# Patient Record
Sex: Female | Born: 1995 | Race: White | Hispanic: No | Marital: Single | Smoking: Never smoker
Health system: Southern US, Community
[De-identification: ages and names within clinical notes are randomized; demographics above are authoritative.]

## PROBLEM LIST (undated history)

## (undated) DIAGNOSIS — F419 Anxiety disorder, unspecified: Secondary | ICD-10-CM

## (undated) DIAGNOSIS — F32A Depression, unspecified: Secondary | ICD-10-CM

## (undated) DIAGNOSIS — E282 Polycystic ovarian syndrome: Secondary | ICD-10-CM

## (undated) DIAGNOSIS — K900 Celiac disease: Secondary | ICD-10-CM

## (undated) HISTORY — DX: Celiac disease: K90.0

## (undated) HISTORY — DX: Anxiety disorder, unspecified: F41.9

## (undated) HISTORY — DX: Polycystic ovarian syndrome: E28.2

## (undated) HISTORY — DX: Depression, unspecified: F32.A

---
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
## 2016-04-17 DIAGNOSIS — L309 Dermatitis, unspecified: Secondary | ICD-10-CM | POA: Diagnosis not present

## 2016-04-17 DIAGNOSIS — F902 Attention-deficit hyperactivity disorder, combined type: Secondary | ICD-10-CM | POA: Diagnosis not present

## 2016-04-18 DIAGNOSIS — B350 Tinea barbae and tinea capitis: Secondary | ICD-10-CM | POA: Diagnosis not present

## 2016-09-09 DIAGNOSIS — R51 Headache: Secondary | ICD-10-CM | POA: Diagnosis not present

## 2016-09-09 DIAGNOSIS — F902 Attention-deficit hyperactivity disorder, combined type: Secondary | ICD-10-CM | POA: Diagnosis not present

## 2016-09-09 DIAGNOSIS — J309 Allergic rhinitis, unspecified: Secondary | ICD-10-CM | POA: Diagnosis not present

## 2016-09-09 DIAGNOSIS — F418 Other specified anxiety disorders: Secondary | ICD-10-CM | POA: Diagnosis not present

## 2016-09-14 ENCOUNTER — Emergency Department (HOSPITAL_COMMUNITY)
Admission: EM | Admit: 2016-09-14 | Discharge: 2016-09-14 | Disposition: A | Discharge status: Home / Self Care | Payer: BLUE CROSS/BLUE SHIELD | Attending: Emergency Medicine | Admitting: Emergency Medicine

## 2016-09-14 ENCOUNTER — Encounter (HOSPITAL_COMMUNITY): Payer: Self-pay | Admitting: *Deleted

## 2016-09-14 DIAGNOSIS — R404 Transient alteration of awareness: Secondary | ICD-10-CM | POA: Diagnosis not present

## 2016-09-14 DIAGNOSIS — R55 Syncope and collapse: Secondary | ICD-10-CM

## 2016-09-14 DIAGNOSIS — R42 Dizziness and giddiness: Secondary | ICD-10-CM | POA: Diagnosis not present

## 2016-09-14 LAB — CBC WITH DIFFERENTIAL/PLATELET
BASOS PCT: 0 %
Basophils Absolute: 0 10*3/uL (ref 0.0–0.1)
EOS ABS: 0 10*3/uL (ref 0.0–0.7)
EOS PCT: 0 %
HCT: 36.7 % (ref 36.0–46.0)
Hemoglobin: 12.2 g/dL (ref 12.0–15.0)
LYMPHS ABS: 1.3 10*3/uL (ref 0.7–4.0)
Lymphocytes Relative: 20 %
MCH: 29.1 pg (ref 26.0–34.0)
MCHC: 33.2 g/dL (ref 30.0–36.0)
MCV: 87.6 fL (ref 78.0–100.0)
MONO ABS: 0.3 10*3/uL (ref 0.1–1.0)
MONOS PCT: 5 %
NEUTROS PCT: 75 %
Neutro Abs: 4.8 10*3/uL (ref 1.7–7.7)
PLATELETS: 186 10*3/uL (ref 150–400)
RBC: 4.19 MIL/uL (ref 3.87–5.11)
RDW: 12.6 % (ref 11.5–15.5)
WBC: 6.4 10*3/uL (ref 4.0–10.5)

## 2016-09-14 LAB — BASIC METABOLIC PANEL
Anion gap: 8 (ref 5–15)
BUN: 14 mg/dL (ref 6–20)
CHLORIDE: 105 mmol/L (ref 101–111)
CO2: 25 mmol/L (ref 22–32)
CREATININE: 0.72 mg/dL (ref 0.44–1.00)
Calcium: 8.6 mg/dL — ABNORMAL LOW (ref 8.9–10.3)
GFR calc Af Amer: >60 mL/min (ref >60)
GLUCOSE: 104 mg/dL — AB (ref 65–99)
POTASSIUM: 3.4 mmol/L — AB (ref 3.5–5.1)
Sodium: 138 mmol/L (ref 135–145)

## 2016-09-14 LAB — I-STAT BETA HCG BLOOD, ED (MC, WL, AP ONLY): I-stat hCG, quantitative: <5 m[IU]/mL (ref <5)

## 2016-09-14 LAB — TSH: TSH: 1.57 u[IU]/mL (ref 0.350–4.500)

## 2016-09-14 MED ORDER — SODIUM CHLORIDE 0.9 % IV BOLUS (SEPSIS)
1000.0000 mL | Freq: Once | INTRAVENOUS | Status: AC
  Administered 2016-09-14: 1000 mL via INTRAVENOUS

## 2016-09-14 NOTE — ED Triage Notes (Signed)
Pt arrives via EMS with c/o pt was out laying in the sun, walked over to the shade, laid down and had syncopal episode for about 1 minute. Recently started on zoloft dose. BP 96/60, 500 NS given, now 108/70.

## 2016-09-14 NOTE — Discharge Instructions (Signed)
Your blood work is reassuring. Please drink plenty of fluids.   Please return for worsening symptoms, including recurrent passing out, difficulty breathing, severe chest pain or any other symptoms concerning to you.  Please follow-up with your primary care doctor for re-evaluation

## 2016-09-14 NOTE — ED Provider Notes (Signed)
[facility]-EMERGENCY DEPT Provider Note   CSN: [id number] Arrival date & time: 09/14/16  1555     History   Chief Complaint No chief complaint on file.   HPI Virginia Walker is a 21 y.o. female.  The history is provided by the patient.  Loss of Consciousness   This is a new problem. The current episode started 1 to 2 hours ago. The problem occurs rarely. The problem has been resolved. She lost consciousness for a period of 1 to 5 minutes. The problem is associated with standing up. Associated symptoms include light-headedness, malaise/fatigue and nausea. Pertinent negatives include abdominal pain, back pain, bladder incontinence, chest pain, confusion, diaphoresis, fever, focal sensory loss, focal weakness, headaches, seizures, visual change and vomiting. She has tried certain positions for the symptoms. The treatment provided no relief.    21 year old female who presents with syncope. She has no significant PMH. Was sun tanning at the pool today. She got up from lying down and felt nauseous, dizzy, and saw spots. Tried to sit down, but subsequently passed out. Witnessed by strangers at pool who assisted her. Was told she had LOC for about 1 minutes. No seizure activity, bowel or urinary incontinence, tongue biting, headache, vomiting or diarrhea, melena/hematochezia, fever or chills. Has been very fatigued recently. No weight changes. Eating and drinking normally. No heavy vaginal bleeding. No chest pain or dyspnea. Now feels improved.  History reviewed. No pertinent past medical history.  There are no active problems to display for this patient.   History reviewed. No pertinent surgical history.  OB History    No data available       Home Medications    Prior to Admission medications   Not on File    Family History No family history on file.  Social History Social History  Substance Use Topics  . Smoking status: Never Smoker  . Smokeless tobacco: Never Used  . Alcohol  use Yes     Allergies   Patient has no allergy information on record.   Review of Systems Review of Systems  Constitutional: Positive for malaise/fatigue. Negative for diaphoresis and fever.  Respiratory: Negative for shortness of breath.   Cardiovascular: Positive for syncope. Negative for chest pain.  Gastrointestinal: Positive for nausea. Negative for abdominal pain and vomiting.  Genitourinary: Negative for bladder incontinence and vaginal bleeding.  Musculoskeletal: Negative for back pain.  Allergic/Immunologic: Negative for immunocompromised state.  Neurological: Positive for light-headedness. Negative for focal weakness, seizures and headaches.  Hematological: Does not bruise/bleed easily.  Psychiatric/Behavioral: Negative for confusion.  All other systems reviewed and are negative.    Physical Exam Updated Vital Signs BP 96/79 (BP Location: Right Arm)   Pulse 67   Temp 97.8 F (36.6 C) (Oral)   Resp 17   Ht 5\' 4"  (1.626 m)   Wt 135 lb (61.2 kg)   LMP 08/31/2016   SpO2 100%   BMI 23.17 kg/m   Physical Exam Physical Exam  Nursing note and vitals reviewed. Constitutional: non-toxic, and in no acute distress Head: Normocephalic and atraumatic.  Mouth/Throat: Oropharynx is clear and moist.  Neck: Normal range of motion. Neck supple.  Cardiovascular: Normal rate and regular rhythm.   Pulmonary/Chest: Effort normal and breath sounds normal.  Abdominal: Soft. There is no tenderness. There is no rebound and no guarding.  Musculoskeletal: Normal range of motion. no edema Neurological: Alert, no facial droop, fluent speech, moves all extremities symmetrically, PERRL, sensation to light touch int act throughout. Skin: Skin  is warm and dry.  Psychiatric: Cooperative   ED Treatments / Results  Labs (all labs ordered are listed, but only abnormal results are displayed) Labs Reviewed  BASIC METABOLIC PANEL - Abnormal; Notable for the following:       Result Value    Potassium 3.4 (*)    Glucose, Bld 104 (*)    Calcium 8.6 (*)    All other components within normal limits  CBC WITH DIFFERENTIAL/PLATELET  TSH  I-STAT BETA HCG BLOOD, ED ([facility], [facility], [facility] ONLY)    EKG  EKG Interpretation  Date/Time:  Sunday Sep 14 2016 16:16:10 EDT Ventricular Rate:  75 PR Interval:    QRS Duration: 91 QT Interval:  395 QTC Calculation: 442 R Axis:   59 Text Interpretation:  Sinus rhythm no ischemia or stigmata of arrythmia Confirmed by Trena Dunavan MD, Nathanie Ottley ([id number]) on 09/14/2016 5:07:54 PM       Radiology No results found.  Procedures Procedures (including critical care time)  Medications Ordered in ED Medications  sodium chloride 0.9 % bolus 1,000 mL (1,000 mLs Intravenous New Bag/Given 09/14/16 1632)     Initial Impression / Assessment and Plan / ED Course  I have reviewed the triage vital signs and the nursing notes.  Pertinent labs & imaging results that were available during my care of the patient were reviewed by me and considered in my medical decision making (see chart for details).     Presented with syncopal episode. She is not at her baseline. Her symptoms seem consistent with that of orthostasis. She is otherwise young and healthy and not at high risk for other serious etiologies of syncope. Her EKG does not show stigmata of arrhythmia or heart strain. No major electrolyte or metabolic derangements on blood work. She is not anemic. She is not pregnant. She is given IV fluids, and feels improved. She is felt stable for discharge home. Strict return and follow-up instructions reviewed. She and her father expressed understanding of all discharge instructions and felt comfortable with the plan of care.   Final Clinical Impressions(s) / ED Diagnoses   Final diagnoses:  Syncope and collapse    New Prescriptions New Prescriptions   No medications on file     Lavera Guise, MD 09/14/16 1718

## 2016-09-17 DIAGNOSIS — Z202 Contact with and (suspected) exposure to infections with a predominantly sexual mode of transmission: Secondary | ICD-10-CM | POA: Diagnosis not present

## 2016-09-29 DIAGNOSIS — R5383 Other fatigue: Secondary | ICD-10-CM | POA: Diagnosis not present

## 2016-09-29 DIAGNOSIS — F321 Major depressive disorder, single episode, moderate: Secondary | ICD-10-CM | POA: Diagnosis not present

## 2016-10-13 DIAGNOSIS — M4003 Postural kyphosis, cervicothoracic region: Secondary | ICD-10-CM | POA: Diagnosis not present

## 2016-10-13 DIAGNOSIS — M791 Myalgia: Secondary | ICD-10-CM | POA: Diagnosis not present

## 2016-10-13 DIAGNOSIS — M9902 Segmental and somatic dysfunction of thoracic region: Secondary | ICD-10-CM | POA: Diagnosis not present

## 2016-10-13 DIAGNOSIS — M9901 Segmental and somatic dysfunction of cervical region: Secondary | ICD-10-CM | POA: Diagnosis not present

## 2016-10-15 DIAGNOSIS — M791 Myalgia: Secondary | ICD-10-CM | POA: Diagnosis not present

## 2016-10-15 DIAGNOSIS — M9901 Segmental and somatic dysfunction of cervical region: Secondary | ICD-10-CM | POA: Diagnosis not present

## 2016-10-15 DIAGNOSIS — M4003 Postural kyphosis, cervicothoracic region: Secondary | ICD-10-CM | POA: Diagnosis not present

## 2016-10-15 DIAGNOSIS — M9902 Segmental and somatic dysfunction of thoracic region: Secondary | ICD-10-CM | POA: Diagnosis not present

## 2016-10-16 DIAGNOSIS — M9901 Segmental and somatic dysfunction of cervical region: Secondary | ICD-10-CM | POA: Diagnosis not present

## 2016-10-16 DIAGNOSIS — M4003 Postural kyphosis, cervicothoracic region: Secondary | ICD-10-CM | POA: Diagnosis not present

## 2016-10-16 DIAGNOSIS — M9902 Segmental and somatic dysfunction of thoracic region: Secondary | ICD-10-CM | POA: Diagnosis not present

## 2016-10-16 DIAGNOSIS — M791 Myalgia: Secondary | ICD-10-CM | POA: Diagnosis not present

## 2016-10-20 DIAGNOSIS — F321 Major depressive disorder, single episode, moderate: Secondary | ICD-10-CM | POA: Diagnosis not present

## 2016-10-21 DIAGNOSIS — M4003 Postural kyphosis, cervicothoracic region: Secondary | ICD-10-CM | POA: Diagnosis not present

## 2016-10-21 DIAGNOSIS — M9902 Segmental and somatic dysfunction of thoracic region: Secondary | ICD-10-CM | POA: Diagnosis not present

## 2016-10-21 DIAGNOSIS — M791 Myalgia: Secondary | ICD-10-CM | POA: Diagnosis not present

## 2016-10-21 DIAGNOSIS — M9901 Segmental and somatic dysfunction of cervical region: Secondary | ICD-10-CM | POA: Diagnosis not present

## 2016-10-23 DIAGNOSIS — M4003 Postural kyphosis, cervicothoracic region: Secondary | ICD-10-CM | POA: Diagnosis not present

## 2016-10-23 DIAGNOSIS — M9902 Segmental and somatic dysfunction of thoracic region: Secondary | ICD-10-CM | POA: Diagnosis not present

## 2016-10-23 DIAGNOSIS — M791 Myalgia: Secondary | ICD-10-CM | POA: Diagnosis not present

## 2016-10-23 DIAGNOSIS — M9901 Segmental and somatic dysfunction of cervical region: Secondary | ICD-10-CM | POA: Diagnosis not present

## 2016-11-07 DIAGNOSIS — M9901 Segmental and somatic dysfunction of cervical region: Secondary | ICD-10-CM | POA: Diagnosis not present

## 2016-11-07 DIAGNOSIS — M4003 Postural kyphosis, cervicothoracic region: Secondary | ICD-10-CM | POA: Diagnosis not present

## 2016-11-07 DIAGNOSIS — M791 Myalgia: Secondary | ICD-10-CM | POA: Diagnosis not present

## 2016-11-07 DIAGNOSIS — M9902 Segmental and somatic dysfunction of thoracic region: Secondary | ICD-10-CM | POA: Diagnosis not present

## 2016-11-13 DIAGNOSIS — M4003 Postural kyphosis, cervicothoracic region: Secondary | ICD-10-CM | POA: Diagnosis not present

## 2016-11-13 DIAGNOSIS — M9902 Segmental and somatic dysfunction of thoracic region: Secondary | ICD-10-CM | POA: Diagnosis not present

## 2016-11-13 DIAGNOSIS — M791 Myalgia: Secondary | ICD-10-CM | POA: Diagnosis not present

## 2016-11-13 DIAGNOSIS — M9901 Segmental and somatic dysfunction of cervical region: Secondary | ICD-10-CM | POA: Diagnosis not present

## 2016-11-18 DIAGNOSIS — M9902 Segmental and somatic dysfunction of thoracic region: Secondary | ICD-10-CM | POA: Diagnosis not present

## 2016-11-18 DIAGNOSIS — M9901 Segmental and somatic dysfunction of cervical region: Secondary | ICD-10-CM | POA: Diagnosis not present

## 2016-11-18 DIAGNOSIS — M791 Myalgia: Secondary | ICD-10-CM | POA: Diagnosis not present

## 2016-11-18 DIAGNOSIS — M4003 Postural kyphosis, cervicothoracic region: Secondary | ICD-10-CM | POA: Diagnosis not present

## 2016-11-24 DIAGNOSIS — M9901 Segmental and somatic dysfunction of cervical region: Secondary | ICD-10-CM | POA: Diagnosis not present

## 2016-11-24 DIAGNOSIS — M9902 Segmental and somatic dysfunction of thoracic region: Secondary | ICD-10-CM | POA: Diagnosis not present

## 2016-11-24 DIAGNOSIS — M4003 Postural kyphosis, cervicothoracic region: Secondary | ICD-10-CM | POA: Diagnosis not present

## 2016-11-24 DIAGNOSIS — M791 Myalgia: Secondary | ICD-10-CM | POA: Diagnosis not present

## 2016-12-01 DIAGNOSIS — M4003 Postural kyphosis, cervicothoracic region: Secondary | ICD-10-CM | POA: Diagnosis not present

## 2016-12-01 DIAGNOSIS — M9902 Segmental and somatic dysfunction of thoracic region: Secondary | ICD-10-CM | POA: Diagnosis not present

## 2016-12-01 DIAGNOSIS — M9901 Segmental and somatic dysfunction of cervical region: Secondary | ICD-10-CM | POA: Diagnosis not present

## 2016-12-01 DIAGNOSIS — M791 Myalgia: Secondary | ICD-10-CM | POA: Diagnosis not present

## 2016-12-04 DIAGNOSIS — M9902 Segmental and somatic dysfunction of thoracic region: Secondary | ICD-10-CM | POA: Diagnosis not present

## 2016-12-04 DIAGNOSIS — M9901 Segmental and somatic dysfunction of cervical region: Secondary | ICD-10-CM | POA: Diagnosis not present

## 2016-12-04 DIAGNOSIS — M4003 Postural kyphosis, cervicothoracic region: Secondary | ICD-10-CM | POA: Diagnosis not present

## 2016-12-04 DIAGNOSIS — M791 Myalgia: Secondary | ICD-10-CM | POA: Diagnosis not present

## 2016-12-08 DIAGNOSIS — M791 Myalgia: Secondary | ICD-10-CM | POA: Diagnosis not present

## 2016-12-08 DIAGNOSIS — M4003 Postural kyphosis, cervicothoracic region: Secondary | ICD-10-CM | POA: Diagnosis not present

## 2016-12-08 DIAGNOSIS — M9901 Segmental and somatic dysfunction of cervical region: Secondary | ICD-10-CM | POA: Diagnosis not present

## 2016-12-08 DIAGNOSIS — M9902 Segmental and somatic dysfunction of thoracic region: Secondary | ICD-10-CM | POA: Diagnosis not present

## 2016-12-10 DIAGNOSIS — F902 Attention-deficit hyperactivity disorder, combined type: Secondary | ICD-10-CM | POA: Diagnosis not present

## 2016-12-10 DIAGNOSIS — F321 Major depressive disorder, single episode, moderate: Secondary | ICD-10-CM | POA: Diagnosis not present

## 2016-12-11 DIAGNOSIS — M791 Myalgia: Secondary | ICD-10-CM | POA: Diagnosis not present

## 2016-12-11 DIAGNOSIS — M9901 Segmental and somatic dysfunction of cervical region: Secondary | ICD-10-CM | POA: Diagnosis not present

## 2016-12-11 DIAGNOSIS — M4003 Postural kyphosis, cervicothoracic region: Secondary | ICD-10-CM | POA: Diagnosis not present

## 2016-12-11 DIAGNOSIS — M9902 Segmental and somatic dysfunction of thoracic region: Secondary | ICD-10-CM | POA: Diagnosis not present

## 2017-05-05 DIAGNOSIS — F902 Attention-deficit hyperactivity disorder, combined type: Secondary | ICD-10-CM | POA: Diagnosis not present

## 2017-05-05 DIAGNOSIS — F411 Generalized anxiety disorder: Secondary | ICD-10-CM | POA: Diagnosis not present

## 2017-05-05 DIAGNOSIS — F321 Major depressive disorder, single episode, moderate: Secondary | ICD-10-CM | POA: Diagnosis not present

## 2017-11-09 DIAGNOSIS — J309 Allergic rhinitis, unspecified: Secondary | ICD-10-CM | POA: Diagnosis not present

## 2017-11-09 DIAGNOSIS — F321 Major depressive disorder, single episode, moderate: Secondary | ICD-10-CM | POA: Diagnosis not present

## 2018-02-11 DIAGNOSIS — R630 Anorexia: Secondary | ICD-10-CM | POA: Diagnosis not present

## 2018-02-11 DIAGNOSIS — Z6824 Body mass index (BMI) 24.0-24.9, adult: Secondary | ICD-10-CM | POA: Diagnosis not present

## 2018-02-11 DIAGNOSIS — R110 Nausea: Secondary | ICD-10-CM | POA: Diagnosis not present

## 2018-02-27 DIAGNOSIS — R109 Unspecified abdominal pain: Secondary | ICD-10-CM | POA: Diagnosis not present

## 2018-03-02 DIAGNOSIS — Z6823 Body mass index (BMI) 23.0-23.9, adult: Secondary | ICD-10-CM | POA: Diagnosis not present

## 2018-03-02 DIAGNOSIS — R1011 Right upper quadrant pain: Secondary | ICD-10-CM | POA: Diagnosis not present

## 2018-03-03 DIAGNOSIS — R1011 Right upper quadrant pain: Secondary | ICD-10-CM | POA: Diagnosis not present

## 2018-03-08 DIAGNOSIS — R1084 Generalized abdominal pain: Secondary | ICD-10-CM | POA: Diagnosis not present

## 2018-03-08 DIAGNOSIS — K529 Noninfective gastroenteritis and colitis, unspecified: Secondary | ICD-10-CM | POA: Diagnosis not present

## 2018-03-08 DIAGNOSIS — R630 Anorexia: Secondary | ICD-10-CM | POA: Diagnosis not present

## 2018-03-08 DIAGNOSIS — R112 Nausea with vomiting, unspecified: Secondary | ICD-10-CM | POA: Diagnosis not present

## 2018-03-11 DIAGNOSIS — Z79899 Other long term (current) drug therapy: Secondary | ICD-10-CM | POA: Diagnosis not present

## 2018-03-11 DIAGNOSIS — R630 Anorexia: Secondary | ICD-10-CM | POA: Diagnosis not present

## 2018-03-11 DIAGNOSIS — I10 Essential (primary) hypertension: Secondary | ICD-10-CM | POA: Diagnosis not present

## 2018-03-11 DIAGNOSIS — R1084 Generalized abdominal pain: Secondary | ICD-10-CM | POA: Diagnosis not present

## 2018-03-11 DIAGNOSIS — J45909 Unspecified asthma, uncomplicated: Secondary | ICD-10-CM | POA: Diagnosis not present

## 2018-03-11 DIAGNOSIS — R112 Nausea with vomiting, unspecified: Secondary | ICD-10-CM | POA: Diagnosis not present

## 2018-03-11 DIAGNOSIS — K2950 Unspecified chronic gastritis without bleeding: Secondary | ICD-10-CM | POA: Diagnosis not present

## 2018-03-11 DIAGNOSIS — K529 Noninfective gastroenteritis and colitis, unspecified: Secondary | ICD-10-CM | POA: Diagnosis not present

## 2018-03-11 DIAGNOSIS — R197 Diarrhea, unspecified: Secondary | ICD-10-CM | POA: Diagnosis not present

## 2018-03-11 DIAGNOSIS — Z7982 Long term (current) use of aspirin: Secondary | ICD-10-CM | POA: Diagnosis not present

## 2018-04-04 DIAGNOSIS — S0993XA Unspecified injury of face, initial encounter: Secondary | ICD-10-CM | POA: Diagnosis not present

## 2018-04-04 DIAGNOSIS — S0231XA Fracture of orbital floor, right side, initial encounter for closed fracture: Secondary | ICD-10-CM | POA: Diagnosis not present

## 2018-04-04 DIAGNOSIS — S098XXA Other specified injuries of head, initial encounter: Secondary | ICD-10-CM | POA: Diagnosis not present

## 2018-04-04 DIAGNOSIS — Z7982 Long term (current) use of aspirin: Secondary | ICD-10-CM | POA: Diagnosis not present

## 2018-04-04 DIAGNOSIS — X58XXXA Exposure to other specified factors, initial encounter: Secondary | ICD-10-CM | POA: Diagnosis not present

## 2018-04-04 DIAGNOSIS — S0990XA Unspecified injury of head, initial encounter: Secondary | ICD-10-CM | POA: Diagnosis not present

## 2018-04-04 DIAGNOSIS — S0230XA Fracture of orbital floor, unspecified side, initial encounter for closed fracture: Secondary | ICD-10-CM | POA: Diagnosis not present

## 2018-04-07 DIAGNOSIS — R198 Other specified symptoms and signs involving the digestive system and abdomen: Secondary | ICD-10-CM | POA: Diagnosis not present

## 2018-05-05 DIAGNOSIS — H05331 Deformity of right orbit due to trauma or surgery: Secondary | ICD-10-CM | POA: Diagnosis not present

## 2019-12-12 ENCOUNTER — Encounter: Payer: Self-pay | Admitting: Neurology

## 2019-12-12 ENCOUNTER — Ambulatory Visit (INDEPENDENT_AMBULATORY_CARE_PROVIDER_SITE_OTHER): Payer: 59 | Admitting: Family Medicine

## 2019-12-12 ENCOUNTER — Encounter: Payer: Self-pay | Admitting: Family Medicine

## 2019-12-12 ENCOUNTER — Telehealth: Payer: Self-pay | Admitting: Family Medicine

## 2019-12-12 ENCOUNTER — Other Ambulatory Visit: Payer: Self-pay

## 2019-12-12 VITALS — BP 104/64 | HR 68 | Ht 64.0 in | Wt 126.6 lb

## 2019-12-12 DIAGNOSIS — F329 Major depressive disorder, single episode, unspecified: Secondary | ICD-10-CM

## 2019-12-12 DIAGNOSIS — F32A Depression, unspecified: Secondary | ICD-10-CM

## 2019-12-12 DIAGNOSIS — Z8719 Personal history of other diseases of the digestive system: Secondary | ICD-10-CM

## 2019-12-12 DIAGNOSIS — L749 Eccrine sweat disorder, unspecified: Secondary | ICD-10-CM | POA: Insufficient documentation

## 2019-12-12 DIAGNOSIS — Z87898 Personal history of other specified conditions: Secondary | ICD-10-CM

## 2019-12-12 DIAGNOSIS — F419 Anxiety disorder, unspecified: Secondary | ICD-10-CM

## 2019-12-12 DIAGNOSIS — E739 Lactose intolerance, unspecified: Secondary | ICD-10-CM | POA: Insufficient documentation

## 2019-12-12 DIAGNOSIS — R569 Unspecified convulsions: Secondary | ICD-10-CM | POA: Insufficient documentation

## 2019-12-12 DIAGNOSIS — K9049 Malabsorption due to intolerance, not elsewhere classified: Secondary | ICD-10-CM

## 2019-12-12 MED ORDER — FLUOXETINE HCL 20 MG PO CAPS: 20.0000 mg | ORAL_CAPSULE | Freq: Every day | ORAL | 2 refills | Status: DC

## 2019-12-12 NOTE — Telephone Encounter (Signed)
Orders are in

## 2019-12-12 NOTE — Telephone Encounter (Signed)
Pt would like to come in just for labs that she did not have done today. She has a CPE in Sept but would like to have these labs done this week. Please place orders for these labs.

## 2019-12-12 NOTE — Progress Notes (Signed)
Subjective:    Patient ID: Virginia Walker, female    DOB: 01/11/1996, 24 y.o.   MRN: [id number]  HPI Chief Complaint  Patient presents with   new pt    new pt get established. referrals to  GI- celiac disease, Endocrinology- blood sugars and thyroid issues. seeing therapist for depression on treatment   She is new to the practice and here to establish care. 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  States she was in [place] last year. Has been doing telehealth visits.   Pediatrician was here in [place]. [facility]- Dr. Nash Dimmer   States she has been taking fluoxetine for depression and anxiety. States she is unable to manage without it.  Tried Lexapro but it did not work as well.  States she has an upcoming appt with a therapist at [facility].  Mother with depression.   States she was diagnosed with celiac disease in December 2019 in [place]. States actually she "self diagnosed" but her GI agreed.  States she was told she had "severe damage to my villi". Requests a referral to GI.  States gluten causes severe vomiting, diarrhea and severe dehydration. States she basically has to get IV fluids after eating gluten. States she has "always had GI problems".  Appetite has been limited since the beginning of 2019.  States she is afraid she is malnourished.  States she had an EGD and colonoscopy in 2019 in [facility]   States she lost 30 lbs in two months before she realized her diagnosis. Weight has been stable lately.   Reports a 2 month history of dizziness, sweating and is afraid she has low blood sugars.   States she had a seizure 30 seconds after receiving Moderna vaccine in April.  Reports having a 2nd seizure in June 2-3 minutes after a blood draw. States she did not have health insurance so she did not go to the ED.   Denies fever, chills, chest pain, palpitations, shortness of breath, urinary symptoms.    Social  history: single, works as a Social worker  Denies smoking, drinks alcohol once weekly-3 drinks usually, occasional marijuana use   Immunizations: Moderna but only the first dose    States she has an appt later this week with Dr. Connye Burkitt at [facility]     Reviewed allergies, medications, past medical, surgical, family, and social history.     Review of Systems Pertinent positives and negatives in the history of present illness.     Objective:   Physical Exam BP 104/64    Pulse 68    Ht 5\' 4"  (1.626 m)    Wt 126 lb 9.6 oz (57.4 kg)    BMI 21.73 kg/m         Assessment & Plan:  Anxiety and depression - Plan: FLUoxetine (PROZAC) 20 MG capsule Continue taking Prozac. See therapist as scheduled.   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 Referral to GI per request.   History of GI disease - Plan: Ambulatory referral to Gastroenterology  New onset seizure [facility]) - Plan: Ambulatory referral to Neurology -seizure activity on 2 occasions per patient. Has not been worked up. Refer to neurology for evaluation and to determine if activity was actually seizure activity.   Counseling on hypoglycemia and recommend that she avoid skipping meals, eat small frequent meals with protein to balance blood sugar. Recommend she get  a blood sugar monitor and check her BS when she has symptoms.   She declines labs today stating she would like to bring someone with her for support. States she has a CPE scheduled with me in the next few weeks but she would like to return sooner than that for labs.  Requests specifically thyroid work up and Hgb A1c.

## 2019-12-12 NOTE — Patient Instructions (Addendum)
It was a pleasure meeting you today.  I have refilled your fluoxetine.  You should hear from [facility] gastroenterology to schedule a visit.  I am also referring you to neurology as discussed for new onset seizures.  You may schedule a visit to return when you are ready to do blood work.  Try to avoid skipping meals and try to eat small frequent meals throughout the day that includes protein.  If you decide to get a blood sugar monitoring kit, check your blood sugar when you are having symptoms.  Normal fasting blood sugar is 70-100.  If you decide to wait until your fasting CPE then that is okay as well.

## 2019-12-15 ENCOUNTER — Institutional Professional Consult (permissible substitution): Payer: BLUE CROSS/BLUE SHIELD | Admitting: Family Medicine

## 2019-12-21 ENCOUNTER — Telehealth: Payer: Self-pay | Admitting: Family Medicine

## 2019-12-21 NOTE — Telephone Encounter (Signed)
Pt called and is scheduled for CPE on 9/14. She said she has panic attacks then a seizure every time she gets blood drawn. She wants to see if she can possibly get something prescribed to her a few days before the appt to hopefully prevent this from happening.

## 2019-12-21 NOTE — Telephone Encounter (Signed)
Those labs have been ordered already.

## 2019-12-21 NOTE — Telephone Encounter (Signed)
Please ask if she is getting blood work at her office visit or on a different day. What has she taken in the past for her anxiety?

## 2019-12-21 NOTE — Telephone Encounter (Signed)
Pt states she is wanting to be tested for low blood sugar in an A1c and Tsh. Even though pt has an appt for cpe she does not want these tests to be related to her cpe. Is this okay for her to come in like tomorrow for blood work  She was on lexapro in 2018 and then was put on prozac 2019 and is currently on prozac 20mg  now

## 2019-12-22 ENCOUNTER — Other Ambulatory Visit: Payer: Self-pay | Admitting: Family Medicine

## 2019-12-22 DIAGNOSIS — F419 Anxiety disorder, unspecified: Secondary | ICD-10-CM

## 2019-12-22 MED ORDER — ALPRAZOLAM 0.25 MG PO TABS: 0.5000 mg | ORAL_TABLET | Freq: Once | ORAL | 0 refills | Status: AC

## 2019-12-22 NOTE — Telephone Encounter (Signed)
Pt is coming in Monday afternoon. Has neuro appt in novemeber

## 2019-12-22 NOTE — Telephone Encounter (Signed)
I will send in medication to help with anxiety. She can take this 30-60 minutes before having her labs drawn. She should have someone drive her in case she is sedated and unsafe to drive. Does she have a neurology appt scheduled yet?

## 2019-12-22 NOTE — Telephone Encounter (Signed)
Pt needs something for her nerves prior to coming in as she states she has seizures when she gets blood drawn and needs something to help her calm her nerves. I asked if she had taken anything before like xanax and she said she doesn't remember the name but she had something when she was in hospital before to calm her nerves.

## 2019-12-26 ENCOUNTER — Other Ambulatory Visit: Payer: Self-pay

## 2019-12-26 ENCOUNTER — Encounter: Payer: Self-pay | Admitting: Gastroenterology

## 2019-12-26 ENCOUNTER — Other Ambulatory Visit: Payer: 59

## 2019-12-26 DIAGNOSIS — K9049 Malabsorption due to intolerance, not elsewhere classified: Secondary | ICD-10-CM

## 2019-12-26 DIAGNOSIS — L749 Eccrine sweat disorder, unspecified: Secondary | ICD-10-CM

## 2019-12-26 DIAGNOSIS — F419 Anxiety disorder, unspecified: Secondary | ICD-10-CM

## 2019-12-26 DIAGNOSIS — Z87898 Personal history of other specified conditions: Secondary | ICD-10-CM

## 2019-12-27 LAB — COMPREHENSIVE METABOLIC PANEL
ALT: 11 IU/L (ref 0–32)
AST: 21 IU/L (ref 0–40)
Albumin/Globulin Ratio: 2.2 (ref 1.2–2.2)
Albumin: 4.7 g/dL (ref 3.9–5.0)
Alkaline Phosphatase: 48 IU/L (ref 48–121)
BUN/Creatinine Ratio: 11 (ref 9–23)
BUN: 7 mg/dL (ref 6–20)
Bilirubin Total: 0.5 mg/dL (ref 0.0–1.2)
CO2: 24 mmol/L (ref 20–29)
Calcium: 9.7 mg/dL (ref 8.7–10.2)
Chloride: 103 mmol/L (ref 96–106)
Creatinine, Ser: 0.65 mg/dL (ref 0.57–1.00)
GFR calc Af Amer: 145 mL/min/{1.73_m2} (ref >59)
GFR calc non Af Amer: 126 mL/min/{1.73_m2} (ref >59)
Globulin, Total: 2.1 g/dL (ref 1.5–4.5)
Glucose: 95 mg/dL (ref 65–99)
Potassium: 3.5 mmol/L (ref 3.5–5.2)
Sodium: 141 mmol/L (ref 134–144)
Total Protein: 6.8 g/dL (ref 6.0–8.5)

## 2019-12-27 LAB — CBC WITH DIFFERENTIAL/PLATELET
Basophils Absolute: 0 10*3/uL (ref 0.0–0.2)
Basos: 0 %
EOS (ABSOLUTE): 0 10*3/uL (ref 0.0–0.4)
Eos: 1 %
Hematocrit: 36.6 % (ref 34.0–46.6)
Hemoglobin: 12.4 g/dL (ref 11.1–15.9)
Immature Grans (Abs): 0 10*3/uL (ref 0.0–0.1)
Immature Granulocytes: 1 %
Lymphocytes Absolute: 1.5 10*3/uL (ref 0.7–3.1)
Lymphs: 25 %
MCH: 31.8 pg (ref 26.6–33.0)
MCHC: 33.9 g/dL (ref 31.5–35.7)
MCV: 94 fL (ref 79–97)
Monocytes Absolute: 0.5 10*3/uL (ref 0.1–0.9)
Monocytes: 8 %
Neutrophils Absolute: 3.8 10*3/uL (ref 1.4–7.0)
Neutrophils: 65 %
Platelets: 217 10*3/uL (ref 150–450)
RBC: 3.9 x10E6/uL (ref 3.77–5.28)
RDW: 11.8 % (ref 11.7–15.4)
WBC: 5.8 10*3/uL (ref 3.4–10.8)

## 2019-12-27 LAB — T4, FREE: Free T4: 1.35 ng/dL (ref 0.82–1.77)

## 2019-12-27 LAB — TSH: TSH: 0.951 u[IU]/mL (ref 0.450–4.500)

## 2019-12-27 LAB — HEMOGLOBIN A1C
Est. average glucose Bld gHb Est-mCnc: 103 mg/dL
Hgb A1c MFr Bld: 5.2 % (ref 4.8–5.6)

## 2019-12-27 NOTE — Progress Notes (Signed)
Her labs are all normal including blood counts, electrolytes, kidney, liver, thyroid functions. No sign of diabetes or even prediabetes.

## 2019-12-29 ENCOUNTER — Encounter: Payer: Self-pay | Admitting: Family Medicine

## 2019-12-29 ENCOUNTER — Other Ambulatory Visit: Payer: Self-pay

## 2019-12-29 ENCOUNTER — Ambulatory Visit: Payer: 59 | Admitting: Family Medicine

## 2019-12-29 VITALS — BP 120/80 | HR 92 | Wt 123.2 lb

## 2019-12-29 DIAGNOSIS — R630 Anorexia: Secondary | ICD-10-CM

## 2019-12-29 DIAGNOSIS — F419 Anxiety disorder, unspecified: Secondary | ICD-10-CM

## 2019-12-29 DIAGNOSIS — F32A Depression, unspecified: Secondary | ICD-10-CM

## 2019-12-29 DIAGNOSIS — F329 Major depressive disorder, single episode, unspecified: Secondary | ICD-10-CM

## 2019-12-29 DIAGNOSIS — R110 Nausea: Secondary | ICD-10-CM

## 2019-12-29 DIAGNOSIS — F1290 Cannabis use, unspecified, uncomplicated: Secondary | ICD-10-CM

## 2019-12-29 NOTE — Patient Instructions (Signed)
  Call and schedule a visit with a psychiatrist  [facility]  [phone]  Here are other options for psychiatrists   You can call to schedule your appointment with the psychiatrist/counselor. A few offices are listed below for you to call.    [facility]  Ask for a psychiatrist  [address]  (across from [facility])  [phone]     [facility] [address] [place], [place] [address] [phone]   [facility]  [address], [place], [place] [address]  Phone: [phone]   [facility] [address] [place], [place] [address]  Phone: [phone]

## 2019-12-29 NOTE — Progress Notes (Signed)
   Subjective:    Patient ID: Virginia Walker, female    DOB: 04-30-1995, 24 y.o.   MRN: [id number]  HPI Chief Complaint  Patient presents with  . discuss mediciation    discuss aniexty medicine. doesn't feel like current medicine is working, no appetite, smokes weed everyday so she can get an appetite to help   She is here to discuss anxiety and poor appetite. States she has panic attacks at times related to food.  States she has had nausea and loss of appetite for the past 2 years and has been using marijuana recently to help with her appetite and anxiety.   States she used marijuana prior to coming here this morning.   States she thinks Prozac made her appetite worse so she stopped taking it 2 days ago.   She is seeing a Veterinary surgeon but thinks she may switch.   Denies SI.   She reports having a gluten allergy that is debilitating. She has an upcoming appointment with GI.  States she has to make herself eat.     Reviewed allergies, medications, past medical, surgical, family, and social history.    Review of Systems Pertinent positives and negatives in the history of present illness.     Objective:   Physical Exam BP 120/80   Pulse 92   Wt 123 lb 3.2 oz (55.9 kg)   BMI 21.15 kg/m   Alert and oriented and in on acute distress. Not otherwise examined.      Assessment & Plan:  Anxiety and depression  Poor appetite  Nausea  Marijuana use  Advised her to stop using marijuana. Since she admitted to being under the influence of marijuana today, I encouraged her to call someone to drive her home and she did so.  Discussed continuing on Prozac until she can establish with a psychiatrist. Continue counseling.  No thoughts of self harm per patient.  Discussed that she can go to [facility] urgent care. I also provided her with a list of psychiatry offices  Recommend she see GI as scheduled.

## 2019-12-30 ENCOUNTER — Telehealth: Payer: Self-pay

## 2019-12-30 NOTE — Telephone Encounter (Signed)
Pt. Called to cancel her CPE and also stated that she would never see Vickie again. She wanted to know if we had any other female physicians she could see I told her no we didn't. She said she would also be filling a complaint against Vickie but would not tell me why. Just giving you guys a heads up.

## 2019-12-30 NOTE — Telephone Encounter (Signed)
Let's talk about this Tuesday please

## 2020-01-03 NOTE — Telephone Encounter (Signed)
Noted  

## 2020-01-04 ENCOUNTER — Encounter: Payer: BLUE CROSS/BLUE SHIELD | Admitting: Family Medicine

## 2020-01-10 ENCOUNTER — Encounter: Payer: Medicaid Other | Admitting: Family Medicine

## 2020-01-11 ENCOUNTER — Telehealth: Payer: Self-pay | Admitting: Family Medicine

## 2020-01-11 NOTE — Telephone Encounter (Signed)
Fax refill request from [facility]  Epinephrine 0.3 mg auto inject Quantity 2  Note on request states Patient is very allergic to gluten and requests new rx

## 2020-01-11 NOTE — Telephone Encounter (Signed)
I was informed that she would not be returning to see me and that she was filing a complaint. I do not recall discussing the use of an epinephrine pen therefore I am not able to prescribe this for her.

## 2020-01-13 NOTE — Telephone Encounter (Signed)
Faxed denial back to pharmacy.  

## 2020-02-01 ENCOUNTER — Ambulatory Visit: Payer: Self-pay | Admitting: Gastroenterology

## 2020-02-28 ENCOUNTER — Ambulatory Visit: Payer: 59 | Admitting: Gastroenterology

## 2020-02-28 ENCOUNTER — Encounter: Payer: Self-pay | Admitting: Gastroenterology

## 2020-02-28 VITALS — BP 90/68 | HR 76 | Ht 64.0 in | Wt 121.0 lb

## 2020-02-28 DIAGNOSIS — E739 Lactose intolerance, unspecified: Secondary | ICD-10-CM | POA: Diagnosis not present

## 2020-02-28 DIAGNOSIS — K900 Celiac disease: Secondary | ICD-10-CM | POA: Diagnosis not present

## 2020-02-28 DIAGNOSIS — R109 Unspecified abdominal pain: Secondary | ICD-10-CM

## 2020-02-28 DIAGNOSIS — R197 Diarrhea, unspecified: Secondary | ICD-10-CM

## 2020-02-28 DIAGNOSIS — F419 Anxiety disorder, unspecified: Secondary | ICD-10-CM

## 2020-02-28 MED ORDER — ALPRAZOLAM 1 MG PO TABS: ORAL_TABLET | ORAL | 0 refills | Status: AC

## 2020-02-28 NOTE — Patient Instructions (Addendum)
If you are age 23 or older, your body mass index should be between 23-30. Your Body mass index is 20.77 kg/m. If this is out of the aforementioned range listed, please consider follow up with your Primary Care Provider.  If you are age 60 or younger, your body mass index should be between 19-25. Your Body mass index is 20.77 kg/m. If this is out of the aformentioned range listed, please consider follow up with your Primary Care Provider.   Your provider has requested that you go to the basement level for lab work the day of your endoscopy. Press "B" on the elevator. The lab is located at the first door on the left as you exit the elevator.  You have been scheduled for an endoscopy. Please follow written instructions given to you at your visit today. If you use inhalers (even only as needed), please bring them with you on the day of your procedure.  We have faxed Alprazolam 1 mg to your pharmacy. You will be given 1 tablet to take 30 minutes before having labs.  Shanda Bumps discussed antispasmodics with you today. The name of theses medications are Bentyl (dicyclomine) and Levsin (hyoscamine).  Follow up will be pending the results of your Endoscopy and Labe

## 2020-02-28 NOTE — Progress Notes (Signed)
02/28/2020 Virginia Walker [id number] 1995-12-08   HISTORY OF PRESENT ILLNESS: This is a 24 year old female who is new to our office.  She has been referred here by Maylene Roes, NP, for evaluation regarding some GI issues.  She tells me that she has experienced gastrointestinal issues since she was a child.  Describes having abdominal pain and diarrhea all throughout her childhood.  Says that her parents tried limiting dairy etc. from her diet.  She tells me that 2 years ago she became extremely ill and lost 30 to 40 pounds because she had so much vomiting was only tolerating 200 cal a day and couldn't walk without passing out.  She says that they thought she was having a ruptured gallbladder or something so she was sent to the hospital where imaging studies did not show any issues.  She says she got an urgent EGD and colonoscopy and was told that her "villi were basically nonexistent", that she had celiac disease, but no follow-up was ever performed.  She says that she has gone gluten-free and lactose-free completely for the past 2 years.  She checks all her medications to be sure that they are gluten-free.  She says that she knows that when she has ingested any amounts of gluten as she immediately becomes extremely sick with a 1st symptom of projectile vomiting.  She is asking to be checked to be sure that her "villi have recovered".  She says that over the summer she was really sick and thinks that some of it may have been due to anxiety and depression.  She also found that she has PCOS and some of her abdominal pains have gotten better since treating that.  She said that she was having a lot of stomach pain and diarrhea with vomiting every day.  Some of that has gotten better with starting some antidepressants.  She still says that she has nausea every day.  She has to force feed herself 50 % of the time and has diarrhea about 50 % of the time.  Says her abdominal pains were only about a 2-4 out  of 10 on the pain scale.  Previously when she was diagnosed with celiac disease she was having pains that she rated as an 8, 9, 10 out of 10 on the pain scale.  She tells me that she is afraid of needles and getting blood work done.  She says that she "seizes" when she has blood work done, but the last time she had some performed they gave her 0.5 mg of Xanax and that helped to calm her down some so that she did not seize, but she is requesting a higher dose of Xanax to take prior to coming for labs or any type of endoscopic procedure.  Has ongoing fatigue.  Of note, she did express some dissatisfaction with some other healthcare provider that she has encountered previously.  History reviewed. No pertinent past medical history. History reviewed. No pertinent surgical history.  reports that she has never smoked. She has never used smokeless tobacco. She reports current alcohol use. She reports that she does not use drugs. family history is not on file. Allergies  Allergen Reactions  . Gluten Meal   . Lactose Intolerance (Gi)       Outpatient Encounter Medications as of 02/28/2020  Medication Sig  . Bioflavonoid Products (VITAMIN C PLUS PO) Take by mouth.  . Calcium Carbonate Antacid (TUMS PO) Take by mouth.  . Desvenlafaxine Succinate ER 25  MG TB24 Take 1 tablet by mouth daily.  . Multiple Vitamins-Minerals (MULTIVITAMIN WITH MINERALS) tablet Take 1 tablet by mouth daily.  . Probiotic Product (PROBIOTIC DAILY PO) Take by mouth.  . ALPRAZolam (XANAX) 1 MG tablet Take 1 tablet 30 minutes before having labs drawn  . [DISCONTINUED] FLUoxetine (PROZAC) 20 MG capsule Take 1 capsule (20 mg total) by mouth daily.   No facility-administered encounter medications on file as of 02/28/2020.     REVIEW OF SYSTEMS  : All other systems reviewed and negative except where noted in the History of Present Illness.   PHYSICAL EXAM: BP 90/68   Pulse 76   Ht 5\' 4"  (1.626 m)   Wt 121 lb (54.9 kg)   LMP  02/13/2020 (Approximate)   BMI 20.77 kg/m  General: Well developed white female in no acute distress Head: Normocephalic and atraumatic Eyes:  Sclerae anicteric, conjunctiva pink. Ears: Normal auditory acuity Lungs: Clear throughout to auscultation; no W/R/R. Heart: Regular rate and rhythm; no M/R/G. Abdomen: Soft, non-distended.  BS present. Non-tender. Musculoskeletal: Symmetrical with no gross deformities  Skin: No lesions on visible extremities Extremities: No edema  Neurological: Alert oriented x 4, grossly non-focal Psychological:  Alert and cooperative. Normal mood and affect  ASSESSMENT AND PLAN: *24 year old female with celiac disease diagnosed 2 years ago by endoscopy at a different facility.  She also found that she is lactose intolerant and completely avoids gluten and lactose.  Sounds like she may possibly have some underlying IBS as well in association with her anxiety.  Symptoms include abdominal pain, diarrhea, nausea.  She requested evaluation to be sure that her "villi have recovered".  Definitely is reasonable.  We'll plan for EGD with Dr. 30.  We'll also check her celiac serologies in addition to iron studies, B12, folate levels.  She is asking about a DEXA scan, but I will defer that to her PCP for now.  We discussed trial of an antispasmodic so I gave her the names of Bentyl/dicyclomine and Levsin/hyoscyamine so that she can check on those as to whether they are gluten-free prior to prescribing them.  She asked if she could have the labs performed the same day that she came for her procedure and she requested Xanax to take prior to that.  Sent a prescription for 1 mg dose of alprazolam to be taken 30 min before coming in (she said that previously 0.5 mg was not enough).  **Will obtain previous EGD and colonoscopy reports from 2019.  CC:  Henson, Vickie L, NP-C

## 2020-02-29 NOTE — Progress Notes (Signed)
Reviewed and agree with management plan.  Matisyn Cabeza T. Iyad Deroo, MD FACG [facility]  

## 2020-03-07 ENCOUNTER — Ambulatory Visit (HOSPITAL_COMMUNITY): Payer: Self-pay

## 2020-03-12 ENCOUNTER — Telehealth: Payer: Self-pay | Admitting: Gastroenterology

## 2020-03-12 NOTE — Telephone Encounter (Signed)
Message needs to go to Retia Passe looks like she was with Shanda Bumps and scheduled this procedure.

## 2020-03-14 ENCOUNTER — Ambulatory Visit: Payer: Medicaid Other | Admitting: Nurse Practitioner

## 2020-03-14 ENCOUNTER — Ambulatory Visit (INDEPENDENT_AMBULATORY_CARE_PROVIDER_SITE_OTHER): Payer: 59 | Admitting: Neurology

## 2020-03-14 ENCOUNTER — Telehealth: Payer: Self-pay | Admitting: Gastroenterology

## 2020-03-14 ENCOUNTER — Encounter: Payer: Self-pay | Admitting: Neurology

## 2020-03-14 ENCOUNTER — Other Ambulatory Visit: Payer: Self-pay

## 2020-03-14 VITALS — BP 110/60 | HR 71 | Resp 18 | Ht 64.0 in | Wt 121.0 lb

## 2020-03-14 DIAGNOSIS — R55 Syncope and collapse: Secondary | ICD-10-CM | POA: Diagnosis not present

## 2020-03-14 NOTE — Patient Instructions (Signed)
1. Recommend doing the countermeasures to help with syncopal episodes  2. COVID vaccine info:  [email or web address] or calling [phone], Monday-Friday, 7 a.m.-7 p.m.  3. Follow-up as needed, call for any changes
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
## 2020-03-14 NOTE — Telephone Encounter (Signed)
Patient called to reschedule her procedure however she needs information on when her new appt will be for her covid test

## 2020-03-15 NOTE — Telephone Encounter (Signed)
Left message that Covid testing would need to be moved to November 24 and would need to be done by 10 am

## 2020-03-19 ENCOUNTER — Encounter: Payer: 59 | Admitting: Gastroenterology

## 2020-03-21 ENCOUNTER — Other Ambulatory Visit: Payer: Self-pay | Admitting: Gastroenterology

## 2020-03-21 LAB — SARS CORONAVIRUS 2 (TAT 6-24 HRS): SARS Coronavirus 2: NEGATIVE

## 2020-03-26 ENCOUNTER — Other Ambulatory Visit: Payer: 59

## 2020-03-26 ENCOUNTER — Encounter: Payer: Self-pay | Admitting: Gastroenterology

## 2020-03-26 ENCOUNTER — Other Ambulatory Visit: Payer: Self-pay

## 2020-03-26 ENCOUNTER — Ambulatory Visit (AMBULATORY_SURGERY_CENTER): Payer: 59 | Admitting: Gastroenterology

## 2020-03-26 VITALS — BP 122/70 | HR 62 | Temp 98.1°F | Resp 12 | Ht 64.0 in | Wt 121.0 lb

## 2020-03-26 DIAGNOSIS — K900 Celiac disease: Secondary | ICD-10-CM | POA: Diagnosis not present

## 2020-03-26 MED ORDER — SODIUM CHLORIDE 0.9 % IV SOLN: 500.0000 mL | Freq: Once | INTRAVENOUS | Status: DC

## 2020-03-26 NOTE — Patient Instructions (Signed)
Resume previous diet: gluten free and lactose free Continue current medications Await pathology results Consider GES and REV Obtain previously ordered blood work soon Office visit in 6 wks  YOU HAD AN ENDOSCOPIC PROCEDURE TODAY AT THE [facility]:   Refer to the procedure report that was given to you for any specific questions about what was found during the examination.  If the procedure report does not answer your questions, please call your gastroenterologist to clarify.  If you requested that your care partner not be given the details of your procedure findings, then the procedure report has been included in a sealed envelope for you to review at your convenience later.  YOU SHOULD EXPECT: Some feelings of bloating in the abdomen. Passage of more gas than usual.  Walking can help get rid of the air that was put into your GI tract during the procedure and reduce the bloating. If you had a lower endoscopy (such as a colonoscopy or flexible sigmoidoscopy) you may notice spotting of blood in your stool or on the toilet paper. If you underwent a bowel prep for your procedure, you may not have a normal bowel movement for a few days.  Please Note:  You might notice some irritation and congestion in your nose or some drainage.  This is from the oxygen used during your procedure.  There is no need for concern and it should clear up in a day or so.  SYMPTOMS TO REPORT IMMEDIATELY:    Following upper endoscopy (EGD)  Vomiting of blood or coffee ground material  New chest pain or pain under the shoulder blades  Painful or persistently difficult swallowing  New shortness of breath  Fever of 100F or higher  Black, tarry-looking stools  For urgent or emergent issues, a gastroenterologist can be reached at any hour by calling [phone]. Do not use MyChart messaging for urgent concerns.    DIET:  We do recommend a small meal at first, but then you may proceed to your regular diet.   Drink plenty of fluids but you should avoid alcoholic beverages for 24 hours.  ACTIVITY:  You should plan to take it easy for the rest of today and you should NOT DRIVE or use heavy machinery until tomorrow (because of the sedation medicines used during the test).    FOLLOW UP: Our staff will call the number listed on your records 48-72 hours following your procedure to check on you and address any questions or concerns that you may have regarding the information given to you following your procedure. If we do not reach you, we will leave a message.  We will attempt to reach you two times.  During this call, we will ask if you have developed any symptoms of COVID 19. If you develop any symptoms (ie: fever, flu-like symptoms, shortness of breath, cough etc.) before then, please call [phone].  If you test positive for Covid 19 in the 2 weeks post procedure, please call and report this information to [place].    If any biopsies were taken you will be contacted by phone or by letter within the next 1-3 weeks.  Please call us at [phone] if you have not heard about the biopsies in 3 weeks.    SIGNATURES/CONFIDENTIALITY: You and/or your care partner have signed paperwork which will be entered into your electronic medical record.  These signatures attest to the fact that that the information above on your After Visit Summary has been reviewed and is  understood.  Full responsibility of the confidentiality of this discharge information lies with you and/or your care-partner. 

## 2020-03-26 NOTE — Progress Notes (Signed)
Dr Russella Dar aware of Zofran  Pt has retained solid food, no sedation given after induction

## 2020-03-26 NOTE — Op Note (Signed)
[facility] Patient Name: Virginia Walker Procedure Date: 03/26/2020 9:46 AM MRN: [id number] Endoscopist: Meryl Dare , MD Age: 24 Referring MD:  Date of Birth: June 29, 1995 Gender: Female Account #: [id number] Procedure:                Upper GI endoscopy Indications:              Surveillance procedure, Follow-up of celiac disease                            for celiac disease. Medicines:                Monitored Anesthesia Care Procedure:                Pre-Anesthesia Assessment:                           - Prior to the procedure, a History and Physical                            was performed, and patient medications and                            allergies were reviewed. The patient's tolerance of                            previous anesthesia was also reviewed. The risks                            and benefits of the procedure and the sedation                            options and risks were discussed with the patient.                            All questions were answered, and informed consent                            was obtained. Prior Anticoagulants: The patient has                            taken no previous anticoagulant or antiplatelet                            agents. ASA Grade Assessment: II - A patient with                            mild systemic disease. After reviewing the risks                            and benefits, the patient was deemed in                            satisfactory condition to undergo the procedure.  After obtaining informed consent, the endoscope was                            passed under direct vision. Throughout the                            procedure, the patient's blood pressure, pulse, and                            oxygen saturations were monitored continuously. The                            Endoscope was introduced through the mouth, and                            advanced to the second part  of duodenum. The upper                            GI endoscopy was accomplished without difficulty.                            The patient tolerated the procedure well. Scope In: Scope Out: Findings:                 The examined esophagus was normal.                           A large amount of food (residue) was found in the                            gastric body. Limited the exam.                           The exam of the stomach was otherwise normal.                           The duodenal bulb and second portion of the                            duodenum were normal. Biopsies for histology were                            taken with a cold forceps for evaluation of celiac                            disease. Complications:            No immediate complications. Estimated Blood Loss:     Estimated blood loss was minimal. Impression:               - Normal esophagus.                           - A large amount of food (residue) in the stomach  that limited the exam.                           - Normal duodenal bulb and second portion of the                            duodenum. Biopsied. Recommendation:           - Patient has a contact number available for                            emergencies. The signs and symptoms of potential                            delayed complications were discussed with the                            patient. Return to normal activities tomorrow.                            Written discharge instructions were provided to the                            patient.                           - Resume previous diet: gluten free, lactose free.                           - Continue present medications.                           - Await pathology results.                           - Consider GES at REV..                           - Obtain previously ordered blood work soon.                           - Office visit in 6 weeks. Meryl Dare,  MD 03/26/2020 10:21:57 AM This report has been signed electronically.

## 2020-03-26 NOTE — Progress Notes (Signed)
Report to PACU, RN, vss, BBS= Clear.  

## 2020-03-26 NOTE — Progress Notes (Signed)
Called to room to assist during endoscopic procedure.  Patient ID and intended procedure confirmed with present staff. Received instructions for my participation in the procedure from the performing physician.  

## 2020-03-26 NOTE — Progress Notes (Signed)
VS by CW  Pt extremely anxious and crying in admitting.   No changes to medical or social hx since previsit.

## 2020-03-28 ENCOUNTER — Telehealth: Payer: Self-pay | Admitting: *Deleted

## 2020-03-28 ENCOUNTER — Telehealth: Payer: Self-pay

## 2020-03-28 NOTE — Telephone Encounter (Signed)
NO ANSWER, MESSAGE LEFT FOR PATIENT. 

## 2020-03-28 NOTE — Telephone Encounter (Signed)
  Follow up Call-  Call back number 03/26/2020  Post procedure Call Back phone  # [phone]  Permission to leave phone message Yes  Some recent data might be hidden    LMOM to call back with any questions or concerns.  Also, call back if patient has developed fever, respiratory issues or been dx with COVID or had any family members or close contacts diagnosed since her procedure.

## 2020-04-05 ENCOUNTER — Encounter: Payer: Self-pay | Admitting: Gastroenterology

## 2020-04-09 ENCOUNTER — Ambulatory Visit: Payer: 59

## 2020-04-13 ENCOUNTER — Ambulatory Visit: Payer: Self-pay

## 2020-04-14 ENCOUNTER — Telehealth: Payer: Self-pay

## 2020-04-14 NOTE — Telephone Encounter (Signed)
Patient called --DOB verified - advised patient that we could provide her with a stretcher to administer her vaccine.today, 04/14/20 at the [facility] today. Patient advised that she had relatives to come in to town for holidays so she did not know whether or not she would be coming in. Patient was advised the next time [facility] would be at the [facility] will be 05/05/19 and 05/06/19. Patient stated she understood and hd no other questions/concerns.

## 2020-06-01 ENCOUNTER — Other Ambulatory Visit: Payer: Self-pay

## 2020-06-01 ENCOUNTER — Other Ambulatory Visit: Payer: 59

## 2020-06-01 DIAGNOSIS — Z20822 Contact with and (suspected) exposure to covid-19: Secondary | ICD-10-CM

## 2020-06-02 LAB — SARS-COV-2, NAA 2 DAY TAT

## 2020-06-02 LAB — NOVEL CORONAVIRUS, NAA: SARS-CoV-2, NAA: NOT DETECTED

## 2020-07-18 ENCOUNTER — Ambulatory Visit (INDEPENDENT_AMBULATORY_CARE_PROVIDER_SITE_OTHER): Payer: 59 | Admitting: Otolaryngology

## 2020-07-18 ENCOUNTER — Other Ambulatory Visit: Payer: Self-pay

## 2020-07-18 VITALS — Temp 97.3°F

## 2020-07-18 DIAGNOSIS — R590 Localized enlarged lymph nodes: Secondary | ICD-10-CM

## 2020-07-18 NOTE — Progress Notes (Signed)
Will inform our office HPI: Virginia Walker is a 25 y.o. female who presents is referred by her PCP for evaluation of right neck lymphadenopathy.  Patient states that she had large swollen lymph nodes when she had mono several years ago.  But more recently after getting Covid vaccine shot she developed swelling of her lymph nodes in her right neck.  1 node behind the sternocleidomastoid muscle in 1 in front of the sternocleidomastoid muscle high in the neck in the region of the jugular digastric node.  She states that these were much larger for several weeks but have gone down in the past week..  They are not painful and she is doing well otherwise.Marland Kitchen  Past Medical History:  Diagnosis Date   Anxiety    Celiac disease    Depression    PCOS (polycystic ovarian syndrome)    No past surgical history on file. Social History   Socioeconomic History   Marital status: Single    Spouse name: Not on file   Number of children: 0   Years of education: 15   Highest education level: Not on file  Occupational History   Occupation: self employeed  Tobacco Use   Smoking status: Never Smoker   Smokeless tobacco: Never Used  Building services engineer Use: Never used  Substance and Sexual Activity   Alcohol use: Yes   Drug use: No   Sexual activity: Not on file  Other Topics Concern   Not on file  Social History Narrative   Right handed   Two story home   Drinks occasionally caffeine   Social Determinants of Health   Financial Resource Strain: Not on file  Food Insecurity: Not on file  Transportation Needs: Not on file  Physical Activity: Not on file  Stress: Not on file  Social Connections: Not on file   No family history on file. Allergies  Allergen Reactions   Gluten Meal    Lactose Intolerance (Gi)    Prior to Admission medications   Medication Sig Start Date End Date Taking? Authorizing Provider  ALPRAZolam Prudy Feeler) 1 MG tablet Take 1 tablet 30 minutes before  having labs drawn 02/28/20   Zehr, Princella Pellegrini, PA-C  Bioflavonoid Products (VITAMIN C PLUS PO) Take by mouth.    [provider]  Calcium Carbonate Antacid (TUMS PO) Take by mouth.    [provider]  Desvenlafaxine Succinate ER 25 MG TB24 Take 1 tablet by mouth daily. Currently taken 50mg  once daily 02/16/20   [provider]  Multiple Vitamins-Minerals (MULTIVITAMIN WITH MINERALS) tablet Take 1 tablet by mouth daily.    [provider]  Norgestimate-Eth Estradiol (NYMYO PO) Take by mouth.    [provider]  Probiotic Product (PROBIOTIC DAILY PO) Take by mouth.    [provider]     Positive ROS: Otherwise negative  All other systems have been reviewed and were otherwise negative with the exception of those mentioned in the HPI and as above.  Physical Exam: Constitutional: Alert, well-appearing, no acute distress Ears: External ears without lesions or tenderness. Ear canals are clear bilaterally with intact, clear TMs.  Nasal: External nose without lesions. Clear nasal passages Oral: Lips and gums without lesions. Tongue and palate mucosa without lesions. Posterior oropharynx clear.  Tonsils are average size bilaterally with no exudate. Neck: She has several small palpable lymph nodes less than a centimeter in size.  The upper right jugular digastric node is easily palpable measuring approximately 15 mm in  size but is benign to palpation. Respiratory: Breathing comfortably  Skin: No facial/neck lesions or rash noted.  Procedures  Assessment: Reactive lymphadenopathy.  Benign on exam.  Plan: Reviewed with the patient concerning benign exam today and that lymphadenopathy is consistent with reactive lymphadenopathy especially since the lymph nodes have reduced in size.  No further treatment or biopsy necessary. She will follow-up as needed.   Narda Bonds, MD   CC:

## 2021-05-06 DIAGNOSIS — R69 Illness, unspecified: Secondary | ICD-10-CM | POA: Diagnosis not present

## 2021-05-15 DIAGNOSIS — R69 Illness, unspecified: Secondary | ICD-10-CM | POA: Diagnosis not present

## 2021-05-20 DIAGNOSIS — R69 Illness, unspecified: Secondary | ICD-10-CM | POA: Diagnosis not present

## 2021-05-27 DIAGNOSIS — R69 Illness, unspecified: Secondary | ICD-10-CM | POA: Diagnosis not present

## 2021-06-06 DIAGNOSIS — R69 Illness, unspecified: Secondary | ICD-10-CM | POA: Diagnosis not present

## 2021-06-10 DIAGNOSIS — R69 Illness, unspecified: Secondary | ICD-10-CM | POA: Diagnosis not present

## 2021-06-12 DIAGNOSIS — Z20822 Contact with and (suspected) exposure to covid-19: Secondary | ICD-10-CM | POA: Diagnosis not present

## 2021-06-19 DIAGNOSIS — R69 Illness, unspecified: Secondary | ICD-10-CM | POA: Diagnosis not present

## 2021-06-24 DIAGNOSIS — R69 Illness, unspecified: Secondary | ICD-10-CM | POA: Diagnosis not present

## 2021-07-01 DIAGNOSIS — R569 Unspecified convulsions: Secondary | ICD-10-CM | POA: Diagnosis not present

## 2021-07-01 DIAGNOSIS — Z01419 Encounter for gynecological examination (general) (routine) without abnormal findings: Secondary | ICD-10-CM | POA: Diagnosis not present

## 2021-07-01 DIAGNOSIS — R69 Illness, unspecified: Secondary | ICD-10-CM | POA: Diagnosis not present

## 2021-07-01 DIAGNOSIS — Z3041 Encounter for surveillance of contraceptive pills: Secondary | ICD-10-CM | POA: Diagnosis not present

## 2021-07-09 DIAGNOSIS — R69 Illness, unspecified: Secondary | ICD-10-CM | POA: Diagnosis not present

## 2021-07-15 DIAGNOSIS — R109 Unspecified abdominal pain: Secondary | ICD-10-CM | POA: Diagnosis not present

## 2021-07-15 DIAGNOSIS — R69 Illness, unspecified: Secondary | ICD-10-CM | POA: Diagnosis not present

## 2021-07-17 DIAGNOSIS — R69 Illness, unspecified: Secondary | ICD-10-CM | POA: Diagnosis not present

## 2021-07-17 DIAGNOSIS — F411 Generalized anxiety disorder: Secondary | ICD-10-CM | POA: Diagnosis not present

## 2021-07-17 DIAGNOSIS — R109 Unspecified abdominal pain: Secondary | ICD-10-CM | POA: Diagnosis not present

## 2021-07-17 DIAGNOSIS — F321 Major depressive disorder, single episode, moderate: Secondary | ICD-10-CM | POA: Diagnosis not present

## 2021-07-22 DIAGNOSIS — R69 Illness, unspecified: Secondary | ICD-10-CM | POA: Diagnosis not present

## 2021-07-29 DIAGNOSIS — R69 Illness, unspecified: Secondary | ICD-10-CM | POA: Diagnosis not present

## 2021-08-05 DIAGNOSIS — R69 Illness, unspecified: Secondary | ICD-10-CM | POA: Diagnosis not present

## 2021-08-12 DIAGNOSIS — R69 Illness, unspecified: Secondary | ICD-10-CM | POA: Diagnosis not present

## 2021-08-19 DIAGNOSIS — R69 Illness, unspecified: Secondary | ICD-10-CM | POA: Diagnosis not present

## 2021-08-20 DIAGNOSIS — L03011 Cellulitis of right finger: Secondary | ICD-10-CM | POA: Diagnosis not present

## 2021-08-27 DIAGNOSIS — R69 Illness, unspecified: Secondary | ICD-10-CM | POA: Diagnosis not present

## 2021-09-02 DIAGNOSIS — R69 Illness, unspecified: Secondary | ICD-10-CM | POA: Diagnosis not present

## 2021-09-04 DIAGNOSIS — R69 Illness, unspecified: Secondary | ICD-10-CM | POA: Diagnosis not present

## 2021-09-09 DIAGNOSIS — R69 Illness, unspecified: Secondary | ICD-10-CM | POA: Diagnosis not present

## 2021-10-28 DIAGNOSIS — R69 Illness, unspecified: Secondary | ICD-10-CM | POA: Diagnosis not present

## 2022-01-03 DIAGNOSIS — F410 Panic disorder [episodic paroxysmal anxiety] without agoraphobia: Secondary | ICD-10-CM | POA: Diagnosis not present

## 2022-01-03 DIAGNOSIS — R69 Illness, unspecified: Secondary | ICD-10-CM | POA: Diagnosis not present

## 2022-01-15 DIAGNOSIS — L739 Follicular disorder, unspecified: Secondary | ICD-10-CM | POA: Diagnosis not present

## 2022-01-27 DIAGNOSIS — Z0000 Encounter for general adult medical examination without abnormal findings: Secondary | ICD-10-CM | POA: Diagnosis not present

## 2022-02-24 DIAGNOSIS — F331 Major depressive disorder, recurrent, moderate: Secondary | ICD-10-CM | POA: Diagnosis not present

## 2022-03-17 DIAGNOSIS — F331 Major depressive disorder, recurrent, moderate: Secondary | ICD-10-CM | POA: Diagnosis not present

## 2022-04-07 DIAGNOSIS — F331 Major depressive disorder, recurrent, moderate: Secondary | ICD-10-CM | POA: Diagnosis not present

## 2022-05-05 DIAGNOSIS — F331 Major depressive disorder, recurrent, moderate: Secondary | ICD-10-CM | POA: Diagnosis not present

## 2022-06-06 DIAGNOSIS — R69 Illness, unspecified: Secondary | ICD-10-CM | POA: Diagnosis not present

## 2022-06-12 DIAGNOSIS — R69 Illness, unspecified: Secondary | ICD-10-CM | POA: Diagnosis not present

## 2022-06-13 DIAGNOSIS — R69 Illness, unspecified: Secondary | ICD-10-CM | POA: Diagnosis not present

## 2022-06-24 DIAGNOSIS — R69 Illness, unspecified: Secondary | ICD-10-CM | POA: Diagnosis not present

## 2022-07-03 DIAGNOSIS — F331 Major depressive disorder, recurrent, moderate: Secondary | ICD-10-CM | POA: Diagnosis not present

## 2022-07-04 DIAGNOSIS — F411 Generalized anxiety disorder: Secondary | ICD-10-CM | POA: Diagnosis not present

## 2022-07-04 DIAGNOSIS — F331 Major depressive disorder, recurrent, moderate: Secondary | ICD-10-CM | POA: Diagnosis not present

## 2022-07-04 DIAGNOSIS — F410 Panic disorder [episodic paroxysmal anxiety] without agoraphobia: Secondary | ICD-10-CM | POA: Diagnosis not present

## 2022-07-09 DIAGNOSIS — F410 Panic disorder [episodic paroxysmal anxiety] without agoraphobia: Secondary | ICD-10-CM | POA: Diagnosis not present

## 2022-07-09 DIAGNOSIS — F411 Generalized anxiety disorder: Secondary | ICD-10-CM | POA: Diagnosis not present

## 2022-07-09 DIAGNOSIS — R110 Nausea: Secondary | ICD-10-CM | POA: Diagnosis not present

## 2022-07-14 ENCOUNTER — Other Ambulatory Visit: Payer: Self-pay

## 2022-07-14 ENCOUNTER — Ambulatory Visit (HOSPITAL_COMMUNITY)
Admission: RE | Admit: 2022-07-14 | Discharge: 2022-07-14 | Disposition: A | Discharge status: Home / Self Care | Payer: 59 | Source: Ambulatory Visit | Attending: Family Medicine | Admitting: Family Medicine

## 2022-07-14 ENCOUNTER — Encounter (HOSPITAL_COMMUNITY): Payer: Self-pay

## 2022-07-14 VITALS — BP 98/60 | HR 91 | Temp 98.3°F | Resp 16

## 2022-07-14 DIAGNOSIS — F411 Generalized anxiety disorder: Secondary | ICD-10-CM | POA: Diagnosis not present

## 2022-07-14 DIAGNOSIS — R599 Enlarged lymph nodes, unspecified: Secondary | ICD-10-CM | POA: Diagnosis not present

## 2022-07-14 DIAGNOSIS — F410 Panic disorder [episodic paroxysmal anxiety] without agoraphobia: Secondary | ICD-10-CM | POA: Diagnosis not present

## 2022-07-14 DIAGNOSIS — F331 Major depressive disorder, recurrent, moderate: Secondary | ICD-10-CM | POA: Diagnosis not present

## 2022-07-14 NOTE — Discharge Instructions (Signed)
If area becomes very painful please return for evaluation You can follow with the ENT clinic you saw last year if needed

## 2022-07-14 NOTE — ED Triage Notes (Signed)
Pt reports lump below Lt ear. Pt rates pain at 3/10. Pt 's unsure when lump first occurred . Lump first noticed today.

## 2022-07-14 NOTE — ED Provider Notes (Signed)
[facility]    CSN: [id number] Arrival date & time: 07/14/22  1423      History   Chief Complaint Chief Complaint  Patient presents with   Abscess    **Suspected mastoiditis. Hard lump behind ear, does not move with touch. - Entered by patient    HPI Virginia Walker is a 27 y.o. female.  Here with a bump behind the left ear she noticed last night A little tender, 2/10 She was worried it was "fixed" No ear pain or drainage. No fever. No recent illness  She did see ENT in 2022 for reactive lymph nodes  Past Medical History:  Diagnosis Date   Anxiety    Celiac disease    Depression    PCOS (polycystic ovarian syndrome)     Patient Active Problem List   Diagnosis Date Noted   Celiac disease 02/28/2020   Abdominal pain 02/28/2020   Diarrhea 02/28/2020   New onset seizure ([place]) 12/12/2019   History of GI disease 12/12/2019   History of seizures 12/12/2019   Anxiety 12/12/2019   Lactose intolerance 12/12/2019   Sweating abnormality 12/12/2019   History of dizziness 12/12/2019    History reviewed. No pertinent surgical history.  OB History   No obstetric history on file.      Home Medications    Prior to Admission medications   Medication Sig Start Date End Date Taking? Authorizing Provider  ALPRAZolam Duanne Moron) 1 MG tablet Take 1 tablet 30 minutes before having labs drawn 02/28/20   Zehr, Laban Emperor, PA-C  Bioflavonoid Products (VITAMIN C PLUS PO) Take by mouth.    [provider]  Calcium Carbonate Antacid (TUMS PO) Take by mouth.    [provider]  Desvenlafaxine Succinate ER 25 MG TB24 Take 1 tablet by mouth daily. Currently taken 50mg  once daily 02/16/20   [provider]  Multiple Vitamins-Minerals (MULTIVITAMIN WITH MINERALS) tablet Take 1 tablet by mouth daily.    [provider]  Norgestimate-Eth Estradiol (NYMYO PO) Take by mouth.    [provider]  Probiotic Product (PROBIOTIC DAILY PO) Take  by mouth.    [provider]    Family History History reviewed. No pertinent family history.  Social History Social History   Tobacco Use   Smoking status: Never   Smokeless tobacco: Never  Vaping Use   Vaping Use: Never used  Substance Use Topics   Alcohol use: Yes   Drug use: No     Allergies   Gluten meal and Lactose intolerance (gi)   Review of Systems Review of Systems Per HPI  Physical Exam Triage Vital Signs ED Triage Vitals  Enc Vitals Group     BP 07/14/22 1442 98/60     Pulse Rate 07/14/22 1442 91     Resp 07/14/22 1442 16     Temp 07/14/22 1442 98.3 F (36.8 C)     Temp src --      SpO2 07/14/22 1442 99 %     Weight --      Height --      Head Circumference --      Peak Flow --      Pain Score 07/14/22 1440 3     Pain Loc --      Pain Edu? --      Excl. in [place]? --    No data found.  Updated Vital Signs BP 98/60   Pulse 91   Temp 98.3 F (36.8 C)  Resp 16   LMP  (LMP Unknown)   SpO2 99%    Physical Exam Vitals and nursing note reviewed.  Constitutional:      General: She is not in acute distress. HENT:     Head: Normocephalic and atraumatic.     Right Ear: Hearing, tympanic membrane, ear canal and external ear normal. No tenderness. No middle ear effusion. No mastoid tenderness.     Left Ear: Hearing, tympanic membrane and ear canal normal. No tenderness.  No middle ear effusion. No mastoid tenderness.     Ears:     Comments: Small 1 cm, nontender, mobile, rubbery post-auricular node on the left. Mastoid bones are symmetrical and non tender. No protrusion of auricles    Mouth/Throat:     Mouth: Mucous membranes are moist.     Pharynx: Oropharynx is clear.  Cardiovascular:     Rate and Rhythm: Normal rate and regular rhythm.  Pulmonary:     Effort: Pulmonary effort is normal.  Lymphadenopathy:     Cervical: No cervical adenopathy.  Neurological:     Mental Status: She is alert and oriented to person, place, and time.      UC Treatments / Results  Labs (all labs ordered are listed, but only abnormal results are displayed) Labs Reviewed - No data to display  EKG  Radiology No results found.  Procedures Procedures (including critical care time)  Medications Ordered in UC Medications - No data to display  Initial Impression / Assessment and Plan / UC Course  I have reviewed the triage vital signs and the nursing notes.  Pertinent labs & imaging results that were available during my care of the patient were reviewed by me and considered in my medical decision making (see chart for details).  Reassurance provided. Mobile lymph node. No concern for mastoiditis or deeper infection. Advised to monitor and if she has increasing pain to be re-evaluated   Final Clinical Impressions(s) / UC Diagnoses   Final diagnoses:  Palpable lymph node     Discharge Instructions      If area becomes very painful please return for evaluation You can follow with the ENT clinic you saw last year if needed    ED Prescriptions   None    PDMP not reviewed this encounter.   Les Pou, [place] 07/14/22 1544

## 2022-07-23 DIAGNOSIS — F331 Major depressive disorder, recurrent, moderate: Secondary | ICD-10-CM | POA: Diagnosis not present

## 2022-07-31 DIAGNOSIS — F331 Major depressive disorder, recurrent, moderate: Secondary | ICD-10-CM | POA: Diagnosis not present

## 2022-08-07 DIAGNOSIS — F411 Generalized anxiety disorder: Secondary | ICD-10-CM | POA: Diagnosis not present

## 2022-08-07 DIAGNOSIS — F331 Major depressive disorder, recurrent, moderate: Secondary | ICD-10-CM | POA: Diagnosis not present

## 2022-08-07 DIAGNOSIS — F410 Panic disorder [episodic paroxysmal anxiety] without agoraphobia: Secondary | ICD-10-CM | POA: Diagnosis not present

## 2022-08-18 DIAGNOSIS — F331 Major depressive disorder, recurrent, moderate: Secondary | ICD-10-CM | POA: Diagnosis not present

## 2022-08-29 DIAGNOSIS — F331 Major depressive disorder, recurrent, moderate: Secondary | ICD-10-CM | POA: Diagnosis not present

## 2022-09-01 DIAGNOSIS — F410 Panic disorder [episodic paroxysmal anxiety] without agoraphobia: Secondary | ICD-10-CM | POA: Diagnosis not present

## 2022-09-01 DIAGNOSIS — F411 Generalized anxiety disorder: Secondary | ICD-10-CM | POA: Diagnosis not present

## 2022-09-01 DIAGNOSIS — F331 Major depressive disorder, recurrent, moderate: Secondary | ICD-10-CM | POA: Diagnosis not present

## 2022-09-11 DIAGNOSIS — F410 Panic disorder [episodic paroxysmal anxiety] without agoraphobia: Secondary | ICD-10-CM | POA: Diagnosis not present

## 2022-09-11 DIAGNOSIS — F411 Generalized anxiety disorder: Secondary | ICD-10-CM | POA: Diagnosis not present

## 2022-09-11 DIAGNOSIS — F331 Major depressive disorder, recurrent, moderate: Secondary | ICD-10-CM | POA: Diagnosis not present

## 2022-09-11 DIAGNOSIS — F4312 Post-traumatic stress disorder, chronic: Secondary | ICD-10-CM | POA: Diagnosis not present

## 2022-09-17 DIAGNOSIS — F410 Panic disorder [episodic paroxysmal anxiety] without agoraphobia: Secondary | ICD-10-CM | POA: Diagnosis not present

## 2022-09-17 DIAGNOSIS — F4312 Post-traumatic stress disorder, chronic: Secondary | ICD-10-CM | POA: Diagnosis not present

## 2022-09-17 DIAGNOSIS — F411 Generalized anxiety disorder: Secondary | ICD-10-CM | POA: Diagnosis not present

## 2022-09-17 DIAGNOSIS — F331 Major depressive disorder, recurrent, moderate: Secondary | ICD-10-CM | POA: Diagnosis not present

## 2022-09-23 DIAGNOSIS — F331 Major depressive disorder, recurrent, moderate: Secondary | ICD-10-CM | POA: Diagnosis not present

## 2022-09-30 DIAGNOSIS — F4312 Post-traumatic stress disorder, chronic: Secondary | ICD-10-CM | POA: Diagnosis not present

## 2022-09-30 DIAGNOSIS — F410 Panic disorder [episodic paroxysmal anxiety] without agoraphobia: Secondary | ICD-10-CM | POA: Diagnosis not present

## 2022-09-30 DIAGNOSIS — F331 Major depressive disorder, recurrent, moderate: Secondary | ICD-10-CM | POA: Diagnosis not present

## 2022-09-30 DIAGNOSIS — F411 Generalized anxiety disorder: Secondary | ICD-10-CM | POA: Diagnosis not present

## 2022-10-08 DIAGNOSIS — F331 Major depressive disorder, recurrent, moderate: Secondary | ICD-10-CM | POA: Diagnosis not present

## 2022-10-14 DIAGNOSIS — F331 Major depressive disorder, recurrent, moderate: Secondary | ICD-10-CM | POA: Diagnosis not present

## 2022-10-21 DIAGNOSIS — F331 Major depressive disorder, recurrent, moderate: Secondary | ICD-10-CM | POA: Diagnosis not present

## 2022-10-23 DIAGNOSIS — F4312 Post-traumatic stress disorder, chronic: Secondary | ICD-10-CM | POA: Diagnosis not present

## 2022-10-23 DIAGNOSIS — F411 Generalized anxiety disorder: Secondary | ICD-10-CM | POA: Diagnosis not present

## 2022-10-23 DIAGNOSIS — F410 Panic disorder [episodic paroxysmal anxiety] without agoraphobia: Secondary | ICD-10-CM | POA: Diagnosis not present

## 2022-10-23 DIAGNOSIS — F331 Major depressive disorder, recurrent, moderate: Secondary | ICD-10-CM | POA: Diagnosis not present

## 2022-11-06 DIAGNOSIS — F4312 Post-traumatic stress disorder, chronic: Secondary | ICD-10-CM | POA: Diagnosis not present

## 2022-11-06 DIAGNOSIS — F411 Generalized anxiety disorder: Secondary | ICD-10-CM | POA: Diagnosis not present

## 2022-11-06 DIAGNOSIS — F410 Panic disorder [episodic paroxysmal anxiety] without agoraphobia: Secondary | ICD-10-CM | POA: Diagnosis not present

## 2022-11-06 DIAGNOSIS — F331 Major depressive disorder, recurrent, moderate: Secondary | ICD-10-CM | POA: Diagnosis not present

## 2022-11-20 DIAGNOSIS — F331 Major depressive disorder, recurrent, moderate: Secondary | ICD-10-CM | POA: Diagnosis not present

## 2022-11-27 DIAGNOSIS — F331 Major depressive disorder, recurrent, moderate: Secondary | ICD-10-CM | POA: Diagnosis not present

## 2022-12-11 DIAGNOSIS — F331 Major depressive disorder, recurrent, moderate: Secondary | ICD-10-CM | POA: Diagnosis not present

## 2022-12-18 DIAGNOSIS — F331 Major depressive disorder, recurrent, moderate: Secondary | ICD-10-CM | POA: Diagnosis not present

## 2022-12-25 DIAGNOSIS — F331 Major depressive disorder, recurrent, moderate: Secondary | ICD-10-CM | POA: Diagnosis not present

## 2023-01-01 DIAGNOSIS — F331 Major depressive disorder, recurrent, moderate: Secondary | ICD-10-CM | POA: Diagnosis not present

## 2023-01-08 DIAGNOSIS — F331 Major depressive disorder, recurrent, moderate: Secondary | ICD-10-CM | POA: Diagnosis not present

## 2023-01-15 DIAGNOSIS — F331 Major depressive disorder, recurrent, moderate: Secondary | ICD-10-CM | POA: Diagnosis not present

## 2023-01-22 DIAGNOSIS — F331 Major depressive disorder, recurrent, moderate: Secondary | ICD-10-CM | POA: Diagnosis not present

## 2023-01-29 DIAGNOSIS — F331 Major depressive disorder, recurrent, moderate: Secondary | ICD-10-CM | POA: Diagnosis not present

## 2023-02-03 DIAGNOSIS — F331 Major depressive disorder, recurrent, moderate: Secondary | ICD-10-CM | POA: Diagnosis not present

## 2023-02-10 DIAGNOSIS — F331 Major depressive disorder, recurrent, moderate: Secondary | ICD-10-CM | POA: Diagnosis not present

## 2023-02-12 DIAGNOSIS — F331 Major depressive disorder, recurrent, moderate: Secondary | ICD-10-CM | POA: Diagnosis not present

## 2023-02-24 DIAGNOSIS — F331 Major depressive disorder, recurrent, moderate: Secondary | ICD-10-CM | POA: Diagnosis not present

## 2023-02-26 DIAGNOSIS — F331 Major depressive disorder, recurrent, moderate: Secondary | ICD-10-CM | POA: Diagnosis not present

## 2023-03-03 DIAGNOSIS — F331 Major depressive disorder, recurrent, moderate: Secondary | ICD-10-CM | POA: Diagnosis not present

## 2023-03-05 DIAGNOSIS — F331 Major depressive disorder, recurrent, moderate: Secondary | ICD-10-CM | POA: Diagnosis not present

## 2023-03-10 DIAGNOSIS — F331 Major depressive disorder, recurrent, moderate: Secondary | ICD-10-CM | POA: Diagnosis not present

## 2023-03-17 DIAGNOSIS — F331 Major depressive disorder, recurrent, moderate: Secondary | ICD-10-CM | POA: Diagnosis not present

## 2023-03-18 DIAGNOSIS — F331 Major depressive disorder, recurrent, moderate: Secondary | ICD-10-CM | POA: Diagnosis not present

## 2023-03-19 DIAGNOSIS — F331 Major depressive disorder, recurrent, moderate: Secondary | ICD-10-CM | POA: Diagnosis not present

## 2023-04-01 DIAGNOSIS — F410 Panic disorder [episodic paroxysmal anxiety] without agoraphobia: Secondary | ICD-10-CM | POA: Diagnosis not present

## 2023-04-01 DIAGNOSIS — F331 Major depressive disorder, recurrent, moderate: Secondary | ICD-10-CM | POA: Diagnosis not present

## 2023-04-01 DIAGNOSIS — F411 Generalized anxiety disorder: Secondary | ICD-10-CM | POA: Diagnosis not present

## 2023-04-01 DIAGNOSIS — F4312 Post-traumatic stress disorder, chronic: Secondary | ICD-10-CM | POA: Diagnosis not present

## 2023-04-02 DIAGNOSIS — F331 Major depressive disorder, recurrent, moderate: Secondary | ICD-10-CM | POA: Diagnosis not present

## 2023-04-07 DIAGNOSIS — F331 Major depressive disorder, recurrent, moderate: Secondary | ICD-10-CM | POA: Diagnosis not present

## 2023-04-08 DIAGNOSIS — F331 Major depressive disorder, recurrent, moderate: Secondary | ICD-10-CM | POA: Diagnosis not present

## 2023-04-09 DIAGNOSIS — F331 Major depressive disorder, recurrent, moderate: Secondary | ICD-10-CM | POA: Diagnosis not present

## 2023-04-14 DIAGNOSIS — F331 Major depressive disorder, recurrent, moderate: Secondary | ICD-10-CM | POA: Diagnosis not present

## 2023-04-15 DIAGNOSIS — F331 Major depressive disorder, recurrent, moderate: Secondary | ICD-10-CM | POA: Diagnosis not present

## 2023-05-06 DIAGNOSIS — F331 Major depressive disorder, recurrent, moderate: Secondary | ICD-10-CM | POA: Diagnosis not present

## 2023-05-12 DIAGNOSIS — F331 Major depressive disorder, recurrent, moderate: Secondary | ICD-10-CM | POA: Diagnosis not present

## 2023-05-13 DIAGNOSIS — F331 Major depressive disorder, recurrent, moderate: Secondary | ICD-10-CM | POA: Diagnosis not present

## 2023-05-20 DIAGNOSIS — F331 Major depressive disorder, recurrent, moderate: Secondary | ICD-10-CM | POA: Diagnosis not present

## 2023-05-26 DIAGNOSIS — F331 Major depressive disorder, recurrent, moderate: Secondary | ICD-10-CM | POA: Diagnosis not present

## 2023-06-03 DIAGNOSIS — F331 Major depressive disorder, recurrent, moderate: Secondary | ICD-10-CM | POA: Diagnosis not present

## 2023-06-09 DIAGNOSIS — F331 Major depressive disorder, recurrent, moderate: Secondary | ICD-10-CM | POA: Diagnosis not present

## 2023-06-10 DIAGNOSIS — F331 Major depressive disorder, recurrent, moderate: Secondary | ICD-10-CM | POA: Diagnosis not present

## 2023-06-17 DIAGNOSIS — F331 Major depressive disorder, recurrent, moderate: Secondary | ICD-10-CM | POA: Diagnosis not present

## 2023-06-19 DIAGNOSIS — F4312 Post-traumatic stress disorder, chronic: Secondary | ICD-10-CM | POA: Diagnosis not present

## 2023-06-19 DIAGNOSIS — F410 Panic disorder [episodic paroxysmal anxiety] without agoraphobia: Secondary | ICD-10-CM | POA: Diagnosis not present

## 2023-06-19 DIAGNOSIS — F331 Major depressive disorder, recurrent, moderate: Secondary | ICD-10-CM | POA: Diagnosis not present

## 2023-06-19 DIAGNOSIS — F411 Generalized anxiety disorder: Secondary | ICD-10-CM | POA: Diagnosis not present

## 2023-06-23 DIAGNOSIS — F331 Major depressive disorder, recurrent, moderate: Secondary | ICD-10-CM | POA: Diagnosis not present

## 2023-07-01 DIAGNOSIS — F331 Major depressive disorder, recurrent, moderate: Secondary | ICD-10-CM | POA: Diagnosis not present

## 2023-07-01 DIAGNOSIS — F411 Generalized anxiety disorder: Secondary | ICD-10-CM | POA: Diagnosis not present

## 2023-07-01 DIAGNOSIS — F410 Panic disorder [episodic paroxysmal anxiety] without agoraphobia: Secondary | ICD-10-CM | POA: Diagnosis not present

## 2023-07-01 DIAGNOSIS — F4312 Post-traumatic stress disorder, chronic: Secondary | ICD-10-CM | POA: Diagnosis not present

## 2023-07-07 DIAGNOSIS — F331 Major depressive disorder, recurrent, moderate: Secondary | ICD-10-CM | POA: Diagnosis not present

## 2023-07-08 DIAGNOSIS — F331 Major depressive disorder, recurrent, moderate: Secondary | ICD-10-CM | POA: Diagnosis not present

## 2023-07-21 DIAGNOSIS — F331 Major depressive disorder, recurrent, moderate: Secondary | ICD-10-CM | POA: Diagnosis not present

## 2023-07-29 DIAGNOSIS — F331 Major depressive disorder, recurrent, moderate: Secondary | ICD-10-CM | POA: Diagnosis not present

## 2023-07-31 DIAGNOSIS — F331 Major depressive disorder, recurrent, moderate: Secondary | ICD-10-CM | POA: Diagnosis not present

## 2023-08-06 DIAGNOSIS — J0110 Acute frontal sinusitis, unspecified: Secondary | ICD-10-CM | POA: Diagnosis not present

## 2023-08-07 DIAGNOSIS — F331 Major depressive disorder, recurrent, moderate: Secondary | ICD-10-CM | POA: Diagnosis not present

## 2023-08-14 DIAGNOSIS — F331 Major depressive disorder, recurrent, moderate: Secondary | ICD-10-CM | POA: Diagnosis not present

## 2023-08-18 DIAGNOSIS — F331 Major depressive disorder, recurrent, moderate: Secondary | ICD-10-CM | POA: Diagnosis not present

## 2023-08-19 DIAGNOSIS — F331 Major depressive disorder, recurrent, moderate: Secondary | ICD-10-CM | POA: Diagnosis not present

## 2023-08-21 DIAGNOSIS — F331 Major depressive disorder, recurrent, moderate: Secondary | ICD-10-CM | POA: Diagnosis not present

## 2023-08-26 DIAGNOSIS — F331 Major depressive disorder, recurrent, moderate: Secondary | ICD-10-CM | POA: Diagnosis not present

## 2023-09-01 DIAGNOSIS — F331 Major depressive disorder, recurrent, moderate: Secondary | ICD-10-CM | POA: Diagnosis not present

## 2023-09-02 DIAGNOSIS — F331 Major depressive disorder, recurrent, moderate: Secondary | ICD-10-CM | POA: Diagnosis not present
